# Patient Record
Sex: Female | Born: 1975 | Race: Asian | Hispanic: No | Marital: Married
Health system: Southern US, Community
[De-identification: ages and names within clinical notes are randomized; demographics above are authoritative.]

---

## 2013-08-31 ENCOUNTER — Other Ambulatory Visit: Payer: Self-pay

## 2013-10-07 ENCOUNTER — Other Ambulatory Visit (HOSPITAL_COMMUNITY): Payer: Self-pay | Admitting: Obstetrics and Gynecology

## 2013-10-07 DIAGNOSIS — Z0489 Encounter for examination and observation for other specified reasons: Secondary | ICD-10-CM

## 2013-10-07 DIAGNOSIS — IMO0002 Reserved for concepts with insufficient information to code with codable children: Secondary | ICD-10-CM

## 2013-10-07 DIAGNOSIS — O09529 Supervision of elderly multigravida, unspecified trimester: Secondary | ICD-10-CM

## 2013-10-11 ENCOUNTER — Encounter (HOSPITAL_COMMUNITY): Payer: Self-pay

## 2013-10-11 ENCOUNTER — Ambulatory Visit (HOSPITAL_COMMUNITY)
Admission: RE | Admit: 2013-10-11 | Discharge: 2013-10-11 | Disposition: A | Discharge status: Home / Self Care | Payer: Medicaid Other | Source: Ambulatory Visit | Attending: Obstetrics and Gynecology | Admitting: Obstetrics and Gynecology

## 2013-10-11 DIAGNOSIS — O358XX0 Maternal care for other (suspected) fetal abnormality and damage, not applicable or unspecified: Secondary | ICD-10-CM | POA: Insufficient documentation

## 2013-10-11 DIAGNOSIS — O09529 Supervision of elderly multigravida, unspecified trimester: Secondary | ICD-10-CM

## 2013-10-11 DIAGNOSIS — Z1389 Encounter for screening for other disorder: Secondary | ICD-10-CM | POA: Insufficient documentation

## 2013-10-11 DIAGNOSIS — Z363 Encounter for antenatal screening for malformations: Secondary | ICD-10-CM | POA: Insufficient documentation

## 2013-10-11 DIAGNOSIS — Z0489 Encounter for examination and observation for other specified reasons: Secondary | ICD-10-CM

## 2013-10-11 DIAGNOSIS — IMO0002 Reserved for concepts with insufficient information to code with codable children: Secondary | ICD-10-CM

## 2013-10-11 NOTE — ED Notes (Signed)
Appointment Date: 10/11/2013 Referred By: Dahlia BailiffWright, Darren, MD DOB: 03-01-1976 Attending: Dr. Rema FendtJoshua Nitsche   Ms. Padme Port, and her partner, Mr. Gurney MaxinDjun Romah, were seen for genetic counseling regarding a maternal age of 38 y.o.. However, Frances Rice stated that she may be younger than that, they were not certain.  [employer] interpreters provided [place] interpretation over the phone.  They were counseled regarding maternal age and the association with risk for chromosome conditions due to nondisjunction with aging of the ova.   We reviewed chromosomes, nondisjunction, and the associated 1 in 100 risk for fetal aneuploidy related to a maternal age of 38 y.o. at 7450w0d weeks gestation.  If she is younger than this, the risk would be lower.  They were counseled that the risk for aneuploidy decreases as gestational age increases, accounting for those pregnancies which spontaneously abort.  We specifically discussed Down syndrome (trisomy 7821), trisomies 2113 and 4718, and sex chromosome aneuploidies (47,XXX and 47,XXY) including the common features and prognoses of each.   We also reviewed FrancesRegas's NIPS/cell free DNA screen result and the associated reduction in risks for fetal Down syndrome, trisomy 18, and trisomy 13.  They understand that cell free DNA screening provides a pregnancy specific risk for chromosome conditions, but is not considered to be diagnostic.  A complete detailed ultrasound was performed today.  The ultrasound report will be sent under separate cover.   They were counseled that 50-80% of fetuses with Down syndrome and up to 90% of fetuses with trisomies 13 and 18, when well visualized, have detectable anomalies or soft markers by ultrasound. They were counseled regarding the diagnostic option of amniocentesis.  The risks, benefits, and limitations of each of this option were reviewed.  After thoughtful consideration they declined amniocentesis.  They understand that screening tests cannot rule out  all birth defects or genetic syndromes.  The patient was advised of this limitation and states she still does not want diagnostic testing at this time.   Both family histories were reviewed.  Frances Rice and her partner are both from [place].  Persons of [place] descent are at increased risk for certain hemoglobinopathies.  Specifically, they are at an increased risk for beta thalassemia and Hemoglobin E (Hb E).   Persons from [place] are also at increased risk for alpha thalassemia, also an autosomal recessive condition.  There is also a non-deleted alpha chain hemoglobin variant, Hemoglobin Constant Spring (Hb CS) that is relatively common among [place]ians.  Hb CS can lead to Hemoglobin H disease when inherited along with alpha thalassemia trait (aa/--).  Hemoglobinopathy carrier rates in the [place] populations are quite variable and difficult to quantitate specifically.  Frances Rice had a normal hemoglobin electrophoresis, making it unlikely that she is a carrier of one of these hemoglobin conditions.    The remainder of the family history was found to be noncontributory for birth defects, mental retardation, and known genetic conditions. Without further information regarding the provided family history, an accurate genetic risk cannot be calculated. Further genetic counseling is warranted if more information is obtained.  Frances Rice denied exposure to environmental toxins or chemical agents. She denied the use of alcohol, tobacco or street drugs. She denied significant viral illnesses during the course of her pregnancy. Her medical and surgical histories were noncontributory.    I counseled this couple regarding the above risks and available options.  The approximate face-to-face time with the genetic counselor was 40 minutes.  Mady Gemmaaragh Conrad, MS Certified Genetic  Counselor

## 2014-05-15 ENCOUNTER — Encounter (HOSPITAL_COMMUNITY): Payer: Self-pay

## 2014-08-16 ENCOUNTER — Encounter (HOSPITAL_COMMUNITY): Payer: Self-pay | Admitting: *Deleted

## 2019-02-16 ENCOUNTER — Other Ambulatory Visit: Payer: Self-pay | Admitting: Family Medicine

## 2019-02-16 DIAGNOSIS — Z20822 Contact with and (suspected) exposure to covid-19: Secondary | ICD-10-CM

## 2019-02-21 ENCOUNTER — Other Ambulatory Visit: Payer: Self-pay

## 2019-02-21 DIAGNOSIS — Z20822 Contact with and (suspected) exposure to covid-19: Secondary | ICD-10-CM

## 2019-02-22 LAB — NOVEL CORONAVIRUS, NAA: SARS-CoV-2, NAA: NOT DETECTED

## 2019-02-23 ENCOUNTER — Other Ambulatory Visit: Payer: Self-pay | Admitting: *Deleted

## 2019-02-23 DIAGNOSIS — Z20822 Contact with and (suspected) exposure to covid-19: Secondary | ICD-10-CM

## 2019-02-25 LAB — NOVEL CORONAVIRUS, NAA: SARS-CoV-2, NAA: NOT DETECTED
